# Patient Record
Sex: Male | Born: 1995
Health system: Southern US, Community
[De-identification: ages and names within clinical notes are randomized; demographics above are authoritative.]

## PROBLEM LIST (undated history)

## (undated) DIAGNOSIS — F909 Attention-deficit hyperactivity disorder, unspecified type: Secondary | ICD-10-CM

## (undated) HISTORY — DX: Attention-deficit hyperactivity disorder, unspecified type: F90.9

---
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
## 2017-04-06 ENCOUNTER — Ambulatory Visit (INDEPENDENT_AMBULATORY_CARE_PROVIDER_SITE_OTHER): Payer: 59 | Admitting: Nurse Practitioner

## 2017-04-06 ENCOUNTER — Encounter: Payer: Self-pay | Admitting: Nurse Practitioner

## 2017-04-06 VITALS — BP 121/76 | HR 110 | Temp 98.0°F | Ht 64.0 in | Wt 115.0 lb

## 2017-04-06 DIAGNOSIS — J069 Acute upper respiratory infection, unspecified: Secondary | ICD-10-CM | POA: Diagnosis not present

## 2017-04-06 DIAGNOSIS — B9689 Other specified bacterial agents as the cause of diseases classified elsewhere: Secondary | ICD-10-CM | POA: Diagnosis not present

## 2017-04-06 MED ORDER — AZITHROMYCIN 250 MG PO TABS: ORAL_TABLET | ORAL | 0 refills | Status: DC

## 2017-04-06 MED ORDER — BENZONATATE 100 MG PO CAPS: 100.0000 mg | ORAL_CAPSULE | Freq: Three times a day (TID) | ORAL | 0 refills | Status: DC | PRN

## 2017-04-06 NOTE — Progress Notes (Signed)
Subjective:    Patient ID: Tim Waters, male    DOB: 03-11-1995, 22 y.o.   MRN: [id number]  HPI  Patient ocme sin today accompanied by mom with c/o being sick off and on for the last 3 weeks. He has had varying body aches and cough. Slight congestion.   Review of Systems  Constitutional: Positive for chills and fever (?).  HENT: Positive for congestion, sinus pressure and sinus pain. Negative for sore throat and trouble swallowing.   Respiratory: Positive for cough (productive).   Cardiovascular: Negative.   Gastrointestinal: Negative.   Musculoskeletal: Positive for back pain, myalgias and neck pain.  Neurological: Positive for headaches.  All other systems reviewed and are negative.      Objective:   Physical Exam  Constitutional: He is oriented to person, place, and time. He appears well-developed and well-nourished. He appears distressed (mild).  HENT:  Right Ear: Tympanic membrane, external ear and ear canal normal.  Left Ear: Hearing, tympanic membrane, external ear and ear canal normal.  Nose: Mucosal edema and rhinorrhea present. Right sinus exhibits no maxillary sinus tenderness and no frontal sinus tenderness. Left sinus exhibits no maxillary sinus tenderness and no frontal sinus tenderness.  Mouth/Throat: Uvula is midline, oropharynx is clear and moist and mucous membranes are normal.  Neck: Normal range of motion. Neck supple.  Cardiovascular: Normal rate and regular rhythm.  Pulmonary/Chest: Effort normal and breath sounds normal.  Deep dry cough   Lymphadenopathy:    He has no cervical adenopathy.  Neurological: He is alert and oriented to person, place, and time.  Skin: Skin is warm.  Psychiatric: He has a normal mood and affect. His behavior is normal. Judgment and thought content normal.   BP 121/76   Pulse (!) 110   Temp 98 F (36.7 C) (Oral)   Ht 5\' 4"  (1.626 m)   Wt 115 lb (52.2 kg)   BMI 19.74 kg/m         Assessment & Plan:  1.  Bacterial upper respiratory infection 1. Take meds as prescribed 2. Use a cool mist humidifier especially during the winter months and when heat has been humid. 3. Use saline nose sprays frequently 4. Saline irrigations of the nose can be very helpful if done frequently.  * 4X daily for 1 week*  * Use of a nettie pot can be helpful with this. Follow directions with this* 5. Drink plenty of fluids 6. Keep thermostat turn down low 7.For any cough or congestion  Use plain Mucinex- regular strength or max strength is fine   * Children- consult with Pharmacist for dosing 8. For fever or aces or pains- take tylenol or ibuprofen appropriate for age and weight.  * for fevers greater than 101 orally you may alternate ibuprofen and tylenol every  3 hours.   Meds ordered this encounter  Medications  . azithromycin (ZITHROMAX Z-PAK) 250 MG tablet    Sig: As directed    Dispense:  6 tablet    Refill:  0    Order Specific Question:   Supervising Provider    Answer:   VINCENT, CAROL L [[id number]]  . benzonatate (TESSALON PERLES) 100 MG capsule    Sig: Take 1 capsule (100 mg total) by mouth 3 (three) times daily as needed for cough.    Dispense:  20 capsule    Refill:  0    Order Specific Question:   Supervising Provider    Answer:   Johna SheriffVINCENT, CAROL L [[id number]]  Mary-Margaret Hassell Done, Tim Waters

## 2017-04-06 NOTE — Patient Instructions (Signed)

## 2018-09-10 ENCOUNTER — Ambulatory Visit (INDEPENDENT_AMBULATORY_CARE_PROVIDER_SITE_OTHER): Payer: 59 | Admitting: Nurse Practitioner

## 2018-09-10 ENCOUNTER — Encounter: Payer: Self-pay | Admitting: Nurse Practitioner

## 2018-09-10 DIAGNOSIS — J301 Allergic rhinitis due to pollen: Secondary | ICD-10-CM | POA: Diagnosis not present

## 2018-09-10 DIAGNOSIS — R6889 Other general symptoms and signs: Secondary | ICD-10-CM

## 2018-09-10 DIAGNOSIS — Z20822 Contact with and (suspected) exposure to covid-19: Secondary | ICD-10-CM

## 2018-09-10 MED ORDER — FLUTICASONE PROPIONATE 50 MCG/ACT NA SUSP: 2.0000 | Freq: Every day | NASAL | 6 refills | Status: DC

## 2018-09-10 NOTE — Progress Notes (Signed)
   Virtual Visit via telephone Note Due to COVID-19 pandemic this visit was conducted virtually. This visit type was conducted due to national recommendations for restrictions regarding the COVID-19 Pandemic (e.g. social distancing, sheltering in place) in an effort to limit this patient's exposure and mitigate transmission in our community. All issues noted in this document were discussed and addressed.  A physical exam was not performed with this format.  I connected with Tim Waters on 09/10/18 at 1:55 by telephone and verified that I am speaking with the correct person using two identifiers. Tim Waters is currently located at a garage and no one  is currently with him during visit. The provider, Mary-Margaret Hassell Done, FNP is located in their office at time of visit.  I discussed the limitations, risks, security and privacy concerns of performing an evaluation and management service by telephone and the availability of in person appointments. I also discussed with the patient that there may be a patient responsible charge related to this service. The patient expressed understanding and agreed to proceed.   History and Present Illness:   Chief Complaint: GI Problem   HPI Patient calls in today c/o achy all over with slight cough. Feels hot but is shivering. He had some abdominal pain last night. This all started Saturday. Feels no better today except his stomach is no longer hurting. He also says he has had a headache for 3 days. He does have allergy issues that make him feel the same some times.  * he denies currently wearing a mask.    Review of Systems  Constitutional: Positive for chills. Negative for fever.  HENT: Positive for congestion and sinus pain.   Respiratory: Positive for cough.   Neurological: Positive for dizziness and headaches.  All other systems reviewed and are negative.    Observations/Objective: Alert and oriented- answers all questions  appropriately No distress  Assessment and Plan: Tim Waters in today with chief complaint of GI Problem   1. Seasonal allergic rhinitis due to pollen Force fluids - fluticasone (FLONASE) 50 MCG/ACT nasal spray; Place 2 sprays into both nostrils daily.  Dispense: 16 g; Refill: 6  2. Suspected Covid-19 Virus Infection He refuses to be tested- wants a not e to go back to work on Wednesday- I told him if he has covid he should not go back to work and needs to make sure what he has so as not to expose others to virus.    Follow Up Instructions: Prn     I discussed the assessment and treatment plan with the patient. The patient was provided an opportunity to ask questions and all were answered. The patient agreed with the plan and demonstrated an understanding of the instructions.   The patient was advised to call back or seek an in-person evaluation if the symptoms worsen or if the condition fails to improve as anticipated.  The above assessment and management plan was discussed with the patient. The patient verbalized understanding of and has agreed to the management plan. Patient is aware to call the clinic if symptoms persist or worsen. Patient is aware when to return to the clinic for a follow-up visit. Patient educated on when it is appropriate to go to the emergency department.   Time call ended:  12:15  I provided 20 minutes of non-face-to-face time during this encounter.    Mary-Margaret Hassell Done, FNP

## 2018-09-11 ENCOUNTER — Telehealth: Payer: Self-pay | Admitting: Family

## 2018-09-11 NOTE — Telephone Encounter (Signed)
Spoke with mother on phone. Patient was not told he was getting a zpak. He has a virus. Wanted him to be tested for covid.

## 2020-02-13 ENCOUNTER — Other Ambulatory Visit: Payer: Self-pay

## 2020-02-13 DIAGNOSIS — Z20822 Contact with and (suspected) exposure to covid-19: Secondary | ICD-10-CM

## 2020-02-14 LAB — NOVEL CORONAVIRUS, NAA: SARS-CoV-2, NAA: DETECTED — AB

## 2020-02-14 LAB — SARS-COV-2, NAA 2 DAY TAT

## 2020-02-17 ENCOUNTER — Ambulatory Visit: Payer: Self-pay | Admitting: *Deleted

## 2020-02-17 NOTE — Telephone Encounter (Signed)
I got an email in MyChart that I have a positive covid test but I can't find the results.  I see the results in his MyChart but he is not.  I gave him the [facility] number so he could call them for the results plus he needs a copy of his results for work which they send out.   He also mentioned the results were not in the [facility] site either.  He was agreeable to calling [facility] and thanked me for my help.    Reason for Disposition . Health Information question, no triage required and triager able to answer question  Answer Assessment - Initial Assessment Questions 1. REASON FOR CALL or QUESTION: "What is your reason for calling today?" or "How can I best help you?" or "What question do you have that I can help answer?"     I can't find my positive covid test result on MyChart  Protocols used: INFORMATION ONLY CALL - NO TRIAGE-A-AH

## 2020-08-11 ENCOUNTER — Ambulatory Visit (INDEPENDENT_AMBULATORY_CARE_PROVIDER_SITE_OTHER): Payer: BC Managed Care – PPO

## 2020-08-11 ENCOUNTER — Ambulatory Visit: Payer: BC Managed Care – PPO | Admitting: Nurse Practitioner

## 2020-08-11 ENCOUNTER — Other Ambulatory Visit: Payer: Self-pay

## 2020-08-11 ENCOUNTER — Encounter: Payer: Self-pay | Admitting: Nurse Practitioner

## 2020-08-11 VITALS — BP 121/75 | HR 65 | Temp 98.2°F | Resp 20 | Ht 64.0 in | Wt 104.0 lb

## 2020-08-11 DIAGNOSIS — M778 Other enthesopathies, not elsewhere classified: Secondary | ICD-10-CM | POA: Diagnosis not present

## 2020-08-11 DIAGNOSIS — M25532 Pain in left wrist: Secondary | ICD-10-CM

## 2020-08-11 MED ORDER — PREDNISONE 20 MG PO TABS: 40.0000 mg | ORAL_TABLET | Freq: Every day | ORAL | 0 refills | Status: AC

## 2020-08-11 NOTE — Progress Notes (Signed)
   Subjective:    Patient ID: Tim Waters, male    DOB: 04-15-1995, 25 y.o.   MRN: [id number]   Chief Complaint: Wrist Pain (Left/)   HPI Patient comes in today c/o left wrist pain. He works at [employer] and uses his hands a lot. Saturday he was at work and wrist just started hurting. Pain only when he is moving it. Rates pain5/10 crrenlty. Holding it still helps pain. He denies any injury.    Review of Systems  Constitutional:  Negative for diaphoresis.  Eyes:  Negative for pain.  Respiratory:  Negative for shortness of breath.   Cardiovascular:  Negative for chest pain, palpitations and leg swelling.  Gastrointestinal:  Negative for abdominal pain.  Endocrine: Negative for polydipsia.  Skin:  Negative for rash.  Neurological:  Negative for dizziness, weakness and headaches.  Hematological:  Does not bruise/bleed easily.  All other systems reviewed and are negative.     Objective:   Physical Exam Constitutional:      Appearance: Normal appearance.  Musculoskeletal:     Comments: No point tenderness with left wrist- no edema FROM with pain on full flexion. Grips strong  Skin:    General: Skin is warm.  Neurological:     General: No focal deficit present.     Mental Status: He is alert and oriented to person, place, and time.  Psychiatric:        Mood and Affect: Mood normal.        Behavior: Behavior normal.    BP 121/75   Pulse 65   Temp 98.2 F (36.8 C) (Temporal)   Resp 20   Ht 5\' 4"  (1.626 m)   Wt 104 lb (47.2 kg)   SpO2 99%   BMI 17.85 kg/m        Assessment & Plan:  in today with chief complaint of Wrist Pain (Left/)   1. Left wrist pain - DG Wrist Complete Left  2. Tendonitis of wrist, left Ice bid Rest Brace  Meds ordered this encounter  Medications   predniSONE (DELTASONE) 20 MG tablet    Sig: Take 2 tablets (40 mg total) by mouth daily with breakfast for 5 days. 2 po daily for 5 days     Dispense:  10 tablet    Refill:  0    Order Specific Question:   Supervising Provider    Answer:   Tim Waters A [[id number]]        The above assessment and management plan was discussed with the patient. The patient verbalized understanding of and has agreed to the management plan. Patient is aware to call the clinic if symptoms persist or worsen. Patient is aware when to return to the clinic for a follow-up visit. Patient educated on when it is appropriate to go to the emergency department.   Mary-Margaret Arville Care, FNP

## 2020-08-11 NOTE — Patient Instructions (Signed)
Tendinitis  Tendinitis is inflammation of a tendon. A tendon is a strong cord of tissuethat connects muscle to bone. Tendinitis can affect any tendon, but it most commonly affects the: Shoulder tendon (rotator cuff). Ankle tendon (Achilles tendon). Elbow tendon (triceps tendon). Tendons in the wrist. What are the causes? This condition may be caused by: Overusing a tendon or muscle. This is common. Age-related wear and tear. Injury. Inflammatory conditions, such as arthritis. Certain medicines. What increases the risk? You are more likely to develop this condition if you do activities that involve the same movements over and over again (repetitive motions). What are the signs or symptoms? Symptoms of this condition may include: Pain. Tenderness. Mild swelling. Decreased range of motion. How is this diagnosed? This condition is diagnosed with a physical exam. You may also have tests, such as: Ultrasound. This uses sound waves to make an image of the inside of your body in the affected area. MRI. How is this treated? This condition may be treated by resting, icing, applying pressure (compression), and raising (elevating) the affected area above the level of your heart. This is known as RICE therapy. Treatment may also include: Medicines to help reduce inflammation or to help reduce pain. Exercises or physical therapy to strengthen and stretch the tendon. A brace or splint. Surgery. This is rarely needed. Follow these instructions at home: If you have a splint or brace: Wear the splint or brace as told by your health care provider. Remove it only as told by your health care provider. Loosen the splint or brace if your fingers or toes tingle, become numb, or turn cold and blue. Keep the splint or brace clean. If the splint or brace is not waterproof: Do not let it get wet. Cover it with a watertight covering when you take a bath or shower. Managing pain, stiffness, and  swelling If directed, put ice on the affected area. If you have a removable splint or brace, remove it as told by your health care provider. Put ice in a plastic bag. Place a towel between your skin and the bag. Leave the ice on for 20 minutes, 2-3 times a day. Move the fingers or toes of the affected limb often, if this applies. This can help to prevent stiffness and lessen swelling. If directed, raise (elevate) the affected area above the level of your heart while you are sitting or lying down. If directed, apply heat to the affected area before you exercise. Use the heat source that your health care provider recommends, such as a moist heat pack or a heating pad.     Place a towel between your skin and the heat source. Leave the heat on for 20-30 minutes. Remove the heat if your skin turns bright red. This is especially important if you are unable to feel pain, heat, or cold. You may have a greater risk of getting burned.  Driving Do not drive or use heavy machinery while taking prescription pain medicine. Ask your health care provider when it is safe to drive if you have a splint or brace on any part of your arm or leg. Activity Rest the affected area as told by your health care provider. Return to your normal activities as told by your health care provider. Ask your health care provider what activities are safe for you. Avoid using the affected area while you are experiencing symptoms of tendinitis. Do exercises as told by your health care provider. General instructions If you have a splint,   do not put pressure on any part of the splint until it is fully hardened. This may take several hours. Wear an elastic bandage or compression wrap only as told by your health care provider. Take over-the-counter and prescription medicines only as told by your health care provider. Keep all follow-up visits as told by your health care provider. This is important. Contact a health care provider  if: Your symptoms do not improve. You develop new, unexplained problems, such as numbness in your hands. Summary Tendinitis is inflammation of a tendon. You are more likely to develop this condition if you do activities that involve the same movements over and over again. This condition may be treated by resting, icing, applying pressure (compression), and elevating the area above the level of your heart. This is known as RICE therapy. Avoid using the affected area while you are experiencing symptoms of tendinitis. This information is not intended to replace advice given to you by your health care provider. Make sure you discuss any questions you have with your healthcare provider. Document Revised: 07/11/2017 Document Reviewed: 05/24/2017 Elsevier Patient Education  2022 [employer]  

## 2021-05-04 ENCOUNTER — Encounter: Payer: Self-pay | Admitting: Family Medicine

## 2021-05-04 ENCOUNTER — Ambulatory Visit: Payer: BC Managed Care – PPO | Admitting: Family Medicine

## 2021-05-04 VITALS — BP 108/64 | HR 80 | Temp 98.2°F | Ht 64.0 in | Wt 113.8 lb

## 2021-05-04 DIAGNOSIS — M791 Myalgia, unspecified site: Secondary | ICD-10-CM | POA: Diagnosis not present

## 2021-05-04 MED ORDER — NAPROXEN 500 MG PO TABS: 500.0000 mg | ORAL_TABLET | Freq: Two times a day (BID) | ORAL | 0 refills | Status: DC

## 2021-05-04 MED ORDER — METHOCARBAMOL 500 MG PO TABS: 500.0000 mg | ORAL_TABLET | Freq: Three times a day (TID) | ORAL | 0 refills | Status: DC | PRN

## 2021-05-04 NOTE — Patient Instructions (Signed)
Muscle rub such as Biofreeze or Horse Liniment ?Heating pad ?NSAID - Naproxen ?Muscle relaxer - Robaxin ?

## 2021-05-04 NOTE — Progress Notes (Signed)
? ?  Assessment & Plan:  ?1. Muscle pain ?Education provided on muscle pain.  Encouraged use of a muscle rub, heating pad, NSAIDs, and muscle relaxer.  Prescriptions provided for naproxen and Robaxin. ?- naproxen (NAPROSYN) 500 MG tablet; Take 1 tablet (500 mg total) by mouth 2 (two) times daily with a meal.  Dispense: 60 tablet; Refill: 0 ?- methocarbamol (ROBAXIN) 500 MG tablet; Take 1 tablet (500 mg total) by mouth every 8 (eight) hours as needed for muscle spasms.  Dispense: 60 tablet; Refill: 0 ? ? ?Follow up plan: Return if symptoms worsen or fail to improve. ? ?Hendricks Limes, MSN, APRN, FNP-C ?[place] ? ?Subjective:  ? ?Patient ID: Tim Waters, male    DOB: November 21, 1995, 26 y.o.   MRN: [id number] ? ?HPI: ?Tim Waters is a 26 y.o. male presenting on 05/04/2021 for Shoulder Pain (Right x 2-3 days- [facility]) ? ?Patient complaints of right shoulder pain. No known injury. The onset of the pain was  a few days ago .  The pain occurs when active.  No home treatments.  Patient works at [employer]. ? ? ?ROS: Negative unless specifically indicated above in HPI.  ? ?Relevant past medical history reviewed and updated as indicated.  ? ?Allergies and medications reviewed and updated. ? ?No current outpatient medications on file. ? ?Allergies  ?Allergen Reactions  ? Latex   ? Sulfa Antibiotics   ? ? ?Objective:  ? ?BP 108/64   Pulse 80   Temp 98.2 ?F (36.8 ?C) (Temporal)   Ht 5\' 4"  (1.626 m)   Wt 113 lb 12.8 oz (51.6 kg)   BMI 19.53 kg/m?   ? ?Physical Exam ?Vitals reviewed.  ?Constitutional:   ?   General: He is not in acute distress. ?   Appearance: Normal appearance. He is not ill-appearing, toxic-appearing or diaphoretic.  ?HENT:  ?   Head: Normocephalic and atraumatic.  ?Eyes:  ?   General: No scleral icterus.    ?   Right eye: No discharge.     ?   Left eye: No discharge.  ?   Conjunctiva/sclera: Conjunctivae normal.  ?Cardiovascular:  ?   Rate and Rhythm: Normal rate.   ?Pulmonary:  ?   Effort: Pulmonary effort is normal. No respiratory distress.  ?Musculoskeletal:     ?   General: Normal range of motion.  ?   Cervical back: Normal range of motion.  ?   Comments: Pain in right trapezius muscle.  ?Skin: ?   General: Skin is warm and dry.  ?Neurological:  ?   Mental Status: He is alert and oriented to person, place, and time. Mental status is at baseline.  ?Psychiatric:     ?   Mood and Affect: Mood normal.     ?   Behavior: Behavior normal.     ?   Thought Content: Thought content normal.     ?   Judgment: Judgment normal.  ? ? ? ? ? ? ?

## 2021-09-24 ENCOUNTER — Encounter: Payer: Self-pay | Admitting: Nurse Practitioner

## 2021-09-24 ENCOUNTER — Ambulatory Visit: Payer: BC Managed Care – PPO | Admitting: Nurse Practitioner

## 2021-09-24 VITALS — BP 110/71 | HR 96 | Temp 98.7°F | Ht 64.0 in | Wt 109.4 lb

## 2021-09-24 DIAGNOSIS — M778 Other enthesopathies, not elsewhere classified: Secondary | ICD-10-CM | POA: Diagnosis not present

## 2021-09-24 MED ORDER — PREDNISONE 20 MG PO TABS: 40.0000 mg | ORAL_TABLET | Freq: Every day | ORAL | 0 refills | Status: AC

## 2021-09-24 NOTE — Progress Notes (Signed)
   Subjective:    Patient ID: Tim Waters, male    DOB: 05-16-1995, 26 y.o.   MRN: [id number]   Chief Complaint: Wrist Pain (Right x 1 week )   Wrist Pain  The pain is present in the right wrist. This is a new problem. There has been no history of extremity trauma. The problem occurs intermittently. The problem has been waxing and waning. The quality of the pain is described as aching. The pain is at a severity of 4/10. The pain is mild. The symptoms are aggravated by activity. He has tried nothing for the symptoms. The treatment provided no relief. Family history does not include gout or rheumatoid arthritis. There is no history of diabetes, gout, osteoarthritis or rheumatoid arthritis.       Review of Systems  Musculoskeletal:  Positive for arthralgias (RIGHT WRIST). Negative for gout.  Psychiatric/Behavioral:  Positive for hallucinations.        Objective:   Physical Exam Constitutional:      Appearance: Normal appearance.  Cardiovascular:     Rate and Rhythm: Normal rate and regular rhythm.     Heart sounds: Normal heart sounds.  Pulmonary:     Breath sounds: Normal breath sounds.  Musculoskeletal:     Comments: From OF RIGHT WRIST WITHOUT PAIN Point tenderness on dorsal surface of wrist  Skin:    General: Skin is warm.  Neurological:     General: No focal deficit present.     Mental Status: He is alert and oriented to person, place, and time.  Psychiatric:        Mood and Affect: Mood normal.        Behavior: Behavior normal.   BP 110/71   Pulse 96   Temp 98.7 F (37.1 C) (Temporal)   Ht 5\' 4"  (1.626 m)   Wt 109 lb 6.4 oz (49.6 kg)   SpO2 96%   BMI 18.78 kg/m          Assessment & Plan:   in today with chief complaint of Wrist Pain (Right x 1 week )   1. Right wrist tendonitis Ice BID Wear wrist brace Meds ordered this encounter  Medications   predniSONE (DELTASONE) 20 MG tablet    Sig: Take 2 tablets (40 mg  total) by mouth daily with breakfast for 5 days. 2 po daily for 5 days    Dispense:  10 tablet    Refill:  0    Order Specific Question:   Supervising Provider    Answer:   Tim Waters A [[id number]]       The above assessment and management plan was discussed with the patient. The patient verbalized understanding of and has agreed to the management plan. Patient is aware to call the clinic if symptoms persist or worsen. Patient is aware when to return to the clinic for a follow-up visit. Patient educated on when it is appropriate to go to the emergency department.   Mary-Margaret Arville Care, FNP

## 2021-09-24 NOTE — Patient Instructions (Signed)
Flexor Carpi Ulnaris and [employer] Radialis Tendinitis Rehab Ask your health care provider which exercises are safe for you. Do exercises exactly as told by your health care provider and adjust them as directed. It is normal to feel mild stretching, pulling, tightness, or discomfort as you do these exercises. Stop right away if you feel sudden pain or your pain gets worse. Do not begin these exercises until told by your health care provider. Range-of-motion exercises These exercises warm up your muscles and joints and improve the movement and flexibility of your forearm. These exercises also help to relieve pain, numbness, and tingling. They are done using the muscles in your injured forearm. Wrist flexion Sit or stand with your left / right elbow bent to a 90-degree angle (right angle) at your side and your palm facing the floor. Slowly bend your wrist so that your fingers move toward the floor (flexion), stopping when you feel a gentle stretch over the back of your forearm. Hold this position for __________ seconds. Slowly return to the starting position. Repeat __________ times. Complete this exercise __________ times a day. Wrist extension Sit or stand with your left / right elbow bent to a 90-degree angle (right angle) at your side and your palm facing the floor. Slowly lift your wrist up so that your fingers move toward the ceiling (extension), stopping when you feel a gentle stretch on the palm side of your forearm. Hold this position for __________ seconds. Slowly return to the starting position. Repeat __________ times. Complete this exercise __________ times a day. Forearm rotation, supination Sit or stand with your left / right elbow bent to a 90-degree angle (right angle) at your side. Position your forearm so that the thumb is facing the ceiling (neutral position). Turn (rotate) your palm up toward the ceiling (supination) until you feel a gentle stretch on the inside of your  forearm. Hold this position for __________ seconds. Slowly return your palm to the starting position. Repeat __________ times. Complete this exercise __________ times a day. Forearm rotation, pronation Sit or stand with your left / right elbow bent to a 90-degree angle (right angle) at your side. Position your forearm so that the thumb is facing the ceiling (neutral position). Turn (rotate) your palm down toward the floor (pronation) until you feel a gentle stretch on the top of your forearm. Hold this position for __________ seconds. Slowly return your palm to the starting position. Repeat __________ times. Complete this exercise __________ times a day. Stretching exercises These exercises warm up your muscles and joints and improve the movement and flexibility of your forearm. These exercises also help to relieve pain, numbness, and tingling. They are done using your healthy forearm to help stretch the muscles in your injured forearm. Wrist flexion, assisted  Sit or stand and extend your left / right arm in front of you. Turn your palm down toward the floor and relax your wrist. With your other hand (assisted), gently push on the back of your hand to bend your wrist and fingers toward the floor (flexion). Stop when you feel a gentle stretch on the top of your forearm. Hold this position for __________ seconds. Slowly return to the starting position. Repeat __________ times. Complete this exercise __________ times a day. Wrist extension, assisted  Sit or stand and extend your left / right arm in front of you. Turn your palm up toward the ceiling and relax your wrist. With your other hand (assisted), gently pull your palm and fingertips  back so your fingers point down toward the floor (extension). You should feel a gentle stretch on the palm-side of your forearm. Hold this position for __________ seconds. Slowly return to the starting position. Repeat __________ times. Complete this exercise  __________ times a day. Forearm rotation, supination Sit with your left / right elbow bent to a 90-degree angle (right angle) with your forearm resting on a table. Keeping your upper body and shoulder still, use your other hand to turn (rotate) your forearm palm-up (supination) until you feel a gentle to moderate stretch. Hold this position for __________ seconds. Slowly release the stretch, and return to the starting position. Repeat __________ times. Complete this exercise __________ times a day. Forearm rotation, pronation Sit with your left / right elbow bent to a 90-degree angle (right angle) with your forearm resting on a table. Keeping your upper body and shoulder still, use your other hand to turn (rotate) your forearm palm down (pronation) until you feel a gentle to moderate stretch. Hold this position for __________ seconds. Slowly release the stretch, and return to the starting position. Repeat __________ times. Complete this exercise __________ times a day. Strengthening exercises These exercises build strength and endurance in your wrist and forearm. Endurance is the ability to use your muscles for a long time, even after they get tired. Wrist flexion  Sit with your left / right forearm supported on a table and your hand resting palm-up over the edge of the table. Your elbow should be bent to a 90-degree angle (right angle) and rest below the level of your shoulder. Hold a __________ weight in your hand. Or, hold a rubber exercise band or tube in both hands, keeping your hands at the same level and hip distance apart. There should be a slight tension in the exercise band or tube. Slowly lift your hand up toward the ceiling (flexion). Hold this position for __________ seconds. Slowly lower your hand back to the starting position. Repeat __________ times. Complete this exercise __________ times a day. Wrist extension  Sit with your left / right forearm supported on a table and  your hand resting palm-down over the edge of the table. Your elbow should be bent to a 90-degree angle (right angle) and rest below the level of your shoulder. Hold a __________ weight in your hand. Or, hold a rubber exercise band or tube in both hands, keeping your hands at the same level and hip distance apart. There should be a slight tension in the exercise band or tube. Slowly lift your hand up toward the ceiling (extension). Hold this position for __________ seconds. Slowly lower your hand back to the starting position. Repeat __________ times. Complete this exercise __________ times a day. Ulnar deviation Stand with a __________ weight in your left / right hand. Or, sit with your healthy hand supported, and hold on to a rubber exercise band or tube with both hands. There should be a slight tension in the exercise band or tube. Move your wrist so your pinkie travels toward your forearm and your thumb moves away from your forearm (ulnar deviation). Hold this position for __________ seconds. Slowly return to the starting position. Repeat __________ times. Complete this exercise __________ times a day. Radial deviation Stand with a __________ weight in your left / right hand. Or, sit and hold on to a rubber exercise band or tube with both hands while your left / right arm is supported on a table and the other arm is below the  table. There should be a slight tension in the exercise band or tube. If you are holding a weight, raise your left / right hand so your thumb moves toward your forearm at a comfortable height. You will not need to raise your hand very far. If you are holding an exercise band or tube, pull on it to raise your thumb toward the ceiling. You will not need to raise your hand very far. Hold this position for __________ seconds. Slowly lower your wrist to the starting position. Repeat __________ times. Complete this exercise __________ times a day. Forearm rotation,  supination  Sit with your left / right forearm supported on a table. Keep your hand resting palm-down and your wrist over the edge of the table. Your elbow should be at your side and bent to a 90-degree angle (right angle). Keep your wrist stable. Do not allow it to bend backward or forward during the exercise. Gently hold a lightweight hammer with your left / right hand. Without moving your elbow or wrist, slowly turn (rotate) your forearm so your palm faces up toward the ceiling (supination). Hold this position for __________ seconds. Slowly return your forearm to the starting position. Repeat __________ times. Complete this exercise __________ times a day. Forearm rotation, pronation  Sit with your left / right forearm supported on a table. Keep your hand resting palm-up and your wrist over the edge of the table. Your elbow should be at your side and bent to a 90-degree angle (right angle). Keep your wrist stable. Do not allow it to bend backward or forward during the exercise. Gently hold a lightweight hammer with your left / right hand. Without moving your elbow or wrist, slowly turn (rotate) your palm down toward the floor (pronation). Hold this position for __________ seconds. Slowly return your forearm to the starting position. Repeat __________ times. Complete this exercise __________ times a day. Grip  Hold one of these items in your left / right hand: modeling clay, therapy putty, a dense sponge, a stress ball, or a large, rolled sock. Squeeze as hard as you can without increasing any pain. Hold this position for __________ seconds. Slowly release your grip. Repeat __________ times. Complete this exercise __________ times a day. This information is not intended to replace advice given to you by your health care provider. Make sure you discuss any questions you have with your health care provider. Document Revised: 05/01/2018 Document Reviewed: 03/08/2018 Elsevier Patient Education   2023 [employer].

## 2021-10-05 DIAGNOSIS — H5213 Myopia, bilateral: Secondary | ICD-10-CM | POA: Diagnosis not present

## 2021-10-05 DIAGNOSIS — H5210 Myopia, unspecified eye: Secondary | ICD-10-CM | POA: Diagnosis not present

## 2022-12-01 IMAGING — DX DG WRIST COMPLETE 3+V*L*
3 series · 3 of 3 positions shown · non-contrast
Comparison: No prior.

CLINICAL DATA: Left wrist pain.

EXAM:
LEFT WRIST - COMPLETE 3+ VIEW

[wrist ap]
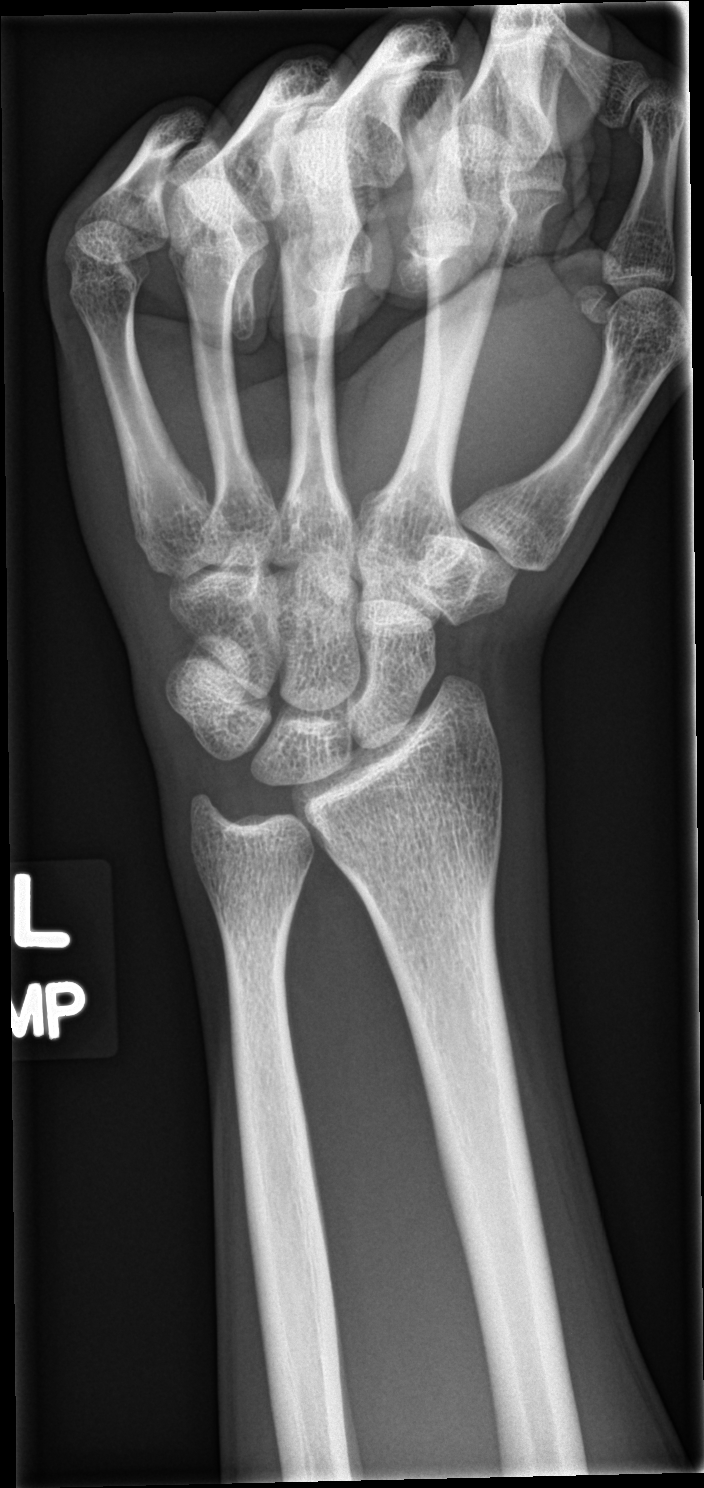

[wrist lat]
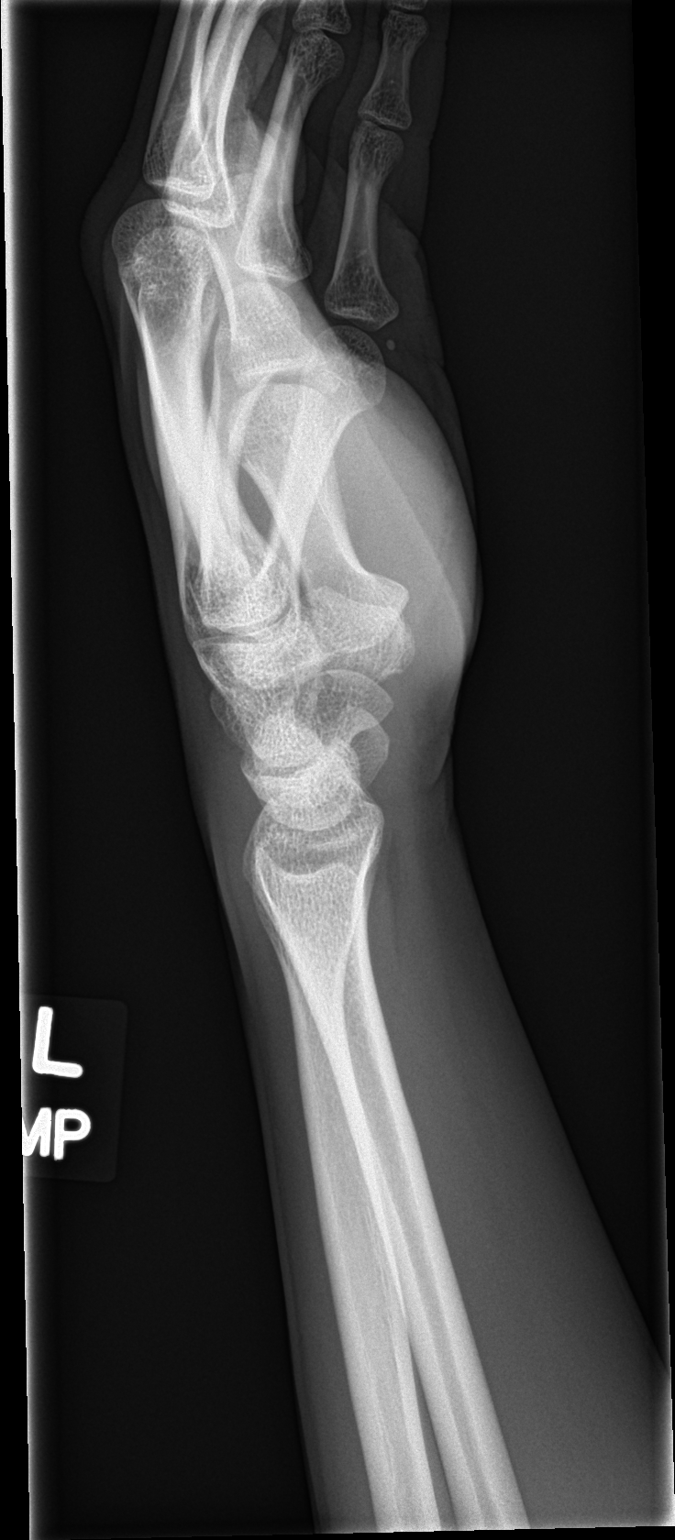

[wrist obl]
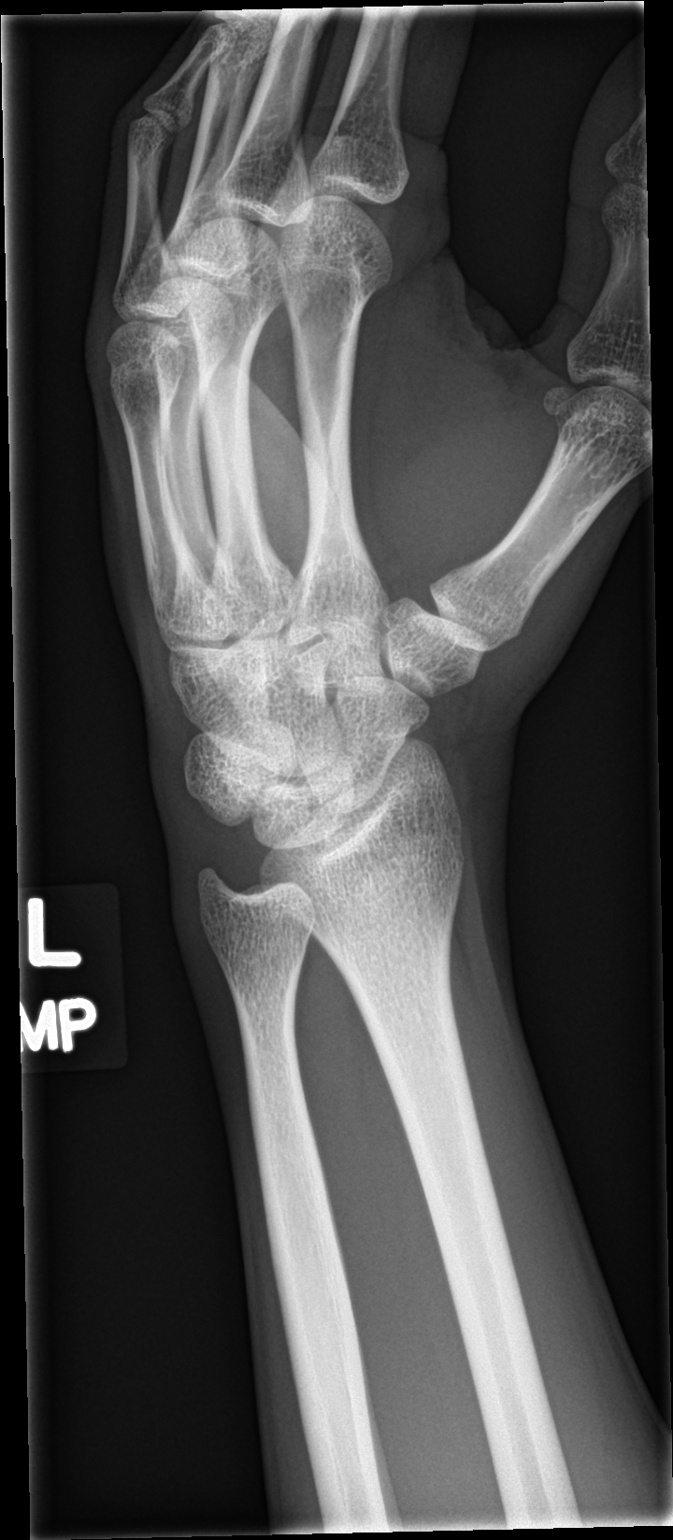

[3 of 3 positions shown; findings below may reference images not displayed]

FINDINGS: No acute bony or joint abnormality. No evidence of fracture or
dislocation.
IMPRESSION: No acute abnormality.

## 2023-03-15 DIAGNOSIS — H5213 Myopia, bilateral: Secondary | ICD-10-CM | POA: Diagnosis not present

## 2023-11-08 ENCOUNTER — Ambulatory Visit (INDEPENDENT_AMBULATORY_CARE_PROVIDER_SITE_OTHER): Payer: Self-pay | Admitting: Family Medicine

## 2023-11-08 ENCOUNTER — Ambulatory Visit: Payer: Self-pay

## 2023-11-08 ENCOUNTER — Encounter: Payer: Self-pay | Admitting: Family Medicine

## 2023-11-08 VITALS — BP 119/70 | HR 71 | Temp 98.2°F | Ht 65.0 in | Wt 120.2 lb

## 2023-11-08 DIAGNOSIS — S93492A Sprain of other ligament of left ankle, initial encounter: Secondary | ICD-10-CM

## 2023-11-08 MED ORDER — DICLOFENAC SODIUM 75 MG PO TBEC: 75.0000 mg | DELAYED_RELEASE_TABLET | Freq: Two times a day (BID) | ORAL | 0 refills | Status: DC | PRN

## 2023-11-08 NOTE — Telephone Encounter (Signed)
 Pt has appt

## 2023-11-08 NOTE — Progress Notes (Signed)
 Subjective: CC: Rolled ankle PCP: Gladis Mustard, FNP YEP:Tim Waters is a 28 y.o. male presenting to clinic today for:  Patient reports that he rolled his ankle about 5 days ago when he stepped in a small divot while walking his dogs.  He notes that he everted his left ankle and really did not have any significant pain until later that evening when he started having some bruising along the heel side of that ankle laterally.  He denies any significant swelling.  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  It is progressively gotten little bit worse.  He just purchased an ankle brace from [employer] and intends to start using it.  He has been utilizing some Tylenol but it really has not been that helpful.  He denies any numbness, tingling or deformity.  He is able to ambulate on it but it is somewhat uncomfortable   ROS: Per HPI  Allergies  Allergen Reactions   Latex    Sulfa Antibiotics    Past Medical History:  Diagnosis Date   ADHD (attention deficit hyperactivity disorder)     Current Outpatient Medications:    diclofenac (VOLTAREN) 75 MG EC tablet, Take 1 tablet (75 mg total) by mouth 2 (two) times daily as needed for moderate pain (pain score 4-6). Take with food. NO other meds except Tylenol if needed over the counter, Disp: 30 tablet, Rfl: 0   methocarbamol  (ROBAXIN ) 500 MG tablet, Take 1 tablet (500 mg total) by mouth every 8 (eight) hours as needed for muscle spasms. (Patient not taking: Reported on 11/08/2023), Disp: 60 tablet, Rfl: 0   naproxen  (NAPROSYN ) 500 MG tablet, Take 1 tablet (500 mg total) by mouth 2 (two) times daily with a meal. (Patient not taking: Reported on 11/08/2023), Disp: 60 tablet, Rfl: 0 Social History   Socioeconomic History   Marital status: Unknown    Spouse name: Not on file   Number of children: Not on file   Years of education: Not on file    Highest education level: Not on file  Occupational History   Not on file  Tobacco Use   Smoking status: Never   Smokeless tobacco: Never  Substance and Sexual Activity   Alcohol use: No   Drug use: No   Sexual activity: Not on file  Other Topics Concern   Not on file  Social History Narrative   ** Merged History Encounter **       Social Drivers of Health   Financial Resource Strain: Not on file  Food Insecurity: Not on file  Transportation Needs: Not on file  Physical Activity: Not on file  Stress: Not on file  Social Connections: Not on file  Intimate Partner Violence: Not on file   Family History  Problem Relation Age of Onset   Hypertension Father    Heart disease Maternal Grandfather    Heart disease Paternal Grandmother    Heart disease Paternal Grandfather     Objective: Office vital signs reviewed. BP 119/70   Pulse 71   Temp 98.2 F (36.8 C)   Ht 5' 5 (1.651 m)   Wt 120 lb 4 oz (54.5 kg)   SpO2 98%   BMI 20.01 kg/m   Physical Examination:  General: Awake, alert, well nourished, No acute distress MSK: Antalgic gait.  He is ambulating independently.  Left ankle with no gross  swelling, ecchymosis, deformity or discoloration.  He has no tenderness palpation to the distal lateral malleolus.  No tenderness palpation over the ATFL or posterior ligaments.  His mild tenderness to palpation is present along the insertion site of the Achilles tendon but there are no palpable abnormalities in this area including fluctuance or evidence of tear.  He is able to dorsiflex and plantarflex his left foot without difficulty.  Assessment/ Plan: 28 y.o. male   Sprain of other ligament of left ankle, initial encounter - Plan: diclofenac (VOLTAREN) 75 MG EC tablet   I am going to treat him as a sprain.  I agree with use of brace.  Encouraged ice.  I am ordering an oral anti-inflammatory.  I would like him to be nonweightbearing for the next couple of days and then he can  resume normal activities at work if he is feeling okay.  There is certainly no evidence of Achilles tendon tear/rupture.  No evidence of fracture or severe sprain on exam.  Follow-up as needed   Norene CHRISTELLA Fielding, DO [facility] [phone]

## 2023-11-08 NOTE — Patient Instructions (Signed)
 Ankle Sprain  An ankle sprain is a stretch or tear in a ligament in your ankle. Ligaments are tissues that connect bones to each other.  An ankle sprain can happen when: The ankle rolls outward. This is called an inversion sprain. The ankle rolls inward. This is called an eversion sprain. What are the causes? An ankle sprain is caused by rolling or twisting your ankle. What increases the risk? You are more likely to get an ankle sprain if you play sports. What are the signs or symptoms?  Pain in your ankle. Swelling. Bruising. Bruises may form right after you sprain your ankle or 1-2 days later. Trouble standing or walking. How is this treated? An ankle sprain may be treated with: A brace or splint. This is used to keep the ankle from moving until it heals. An elastic bandage (dressing). This is used to support the ankle. Crutches. Pain medicine. Surgery. This may be needed if the sprain is very bad. Physical therapy. This can help you move your ankle better. Follow these instructions at home: If you have a brace or a splint that can be taken off: Wear the brace or splint as told by your doctor. Take it off only as told by your doctor. Check the skin around the brace or splint every day. Tell your doctor if you see problems. Loosen the brace or splint if your toes: Tingle. Become numb. Turn cold and blue. Keep the brace or splint clean and dry. If the brace or splint is not waterproof: Do not let it get wet. Cover it with a watertight covering when you take a bath or a shower. If you have an elastic dressing: Take it off to shower or bathe. Adjust it if it feels too tight. Loosen the dressing if your foot: Tingles. Becomes numb. Turns cold and blue. Managing pain, stiffness, and swelling If told, put ice on the affected area. If you have a removable brace or splint, take it off as told by your doctor. Put ice in a plastic bag. Place a towel between your skin and the  bag. Leave the ice on for 20 minutes, 2-3 times a day. If your skin turns bright red, take off the ice right away to prevent skin damage. The risk of damage is higher if you cannot feel pain, heat, or cold. Move your toes often. Raise your ankle above the level of your heart while you are sitting or lying down. General instructions Take over-the-counter and prescription medicines only as told by your doctor. Do not smoke or use any products that contain nicotine or tobacco. If you need help quitting, ask your doctor. Rest your ankle. Use crutches to support your body weight. Do not use your injured leg to support your body weight until your doctor says that you can. Ask your doctor when it is safe to drive if you have a brace or splint on your ankle. Contact a doctor if: Your bruises or swelling get worse all of a sudden. Your pain does not get better after you take medicine. Get help right away if: Your foot or toes are numb or blue. You have very bad pain that gets worse. This information is not intended to replace advice given to you by your health care provider. Make sure you discuss any questions you have with your health care provider. Document Revised: 10/06/2021 Document Reviewed: 10/06/2021 Elsevier Patient Education  2024 [employer].

## 2023-11-08 NOTE — Telephone Encounter (Signed)
 FYI Only or Action Required?: FYI only for provider.  Patient was last seen in primary care on 09/24/2021 by Gladis Mustard, FNP.  Called Nurse Triage reporting Ankle Injury.  Symptoms began several days ago.  Interventions attempted: Nothing.  Symptoms are: gradually worsening.  Triage Disposition: See Physician Within 24 Hours  Patient/caregiver understands and will follow disposition?: Yes        Copied from CRM #[id number]. Topic: Clinical - Red Word Triage >> Nov 08, 2023  9:29 AM Rosaria BRAVO wrote: Red Word that prompted transfer to Nurse Triage: pt fell and injured his ankle. >> Nov 08, 2023  9:34 AM Rosaria BRAVO wrote: Ppt disconnected Reason for Disposition  [1] Limp when walking AND [2] due to twisting injury  Answer Assessment - Initial Assessment Questions Pt states he felt ok the first 1-2 days but the more he walked on it the more painful it became.      1. MECHANISM: How did the injury happen? (e.g., twisting injury, direct blow)      Took dogs out and rolled his ankle in a divet 2. ONSET: When did the injury happen? (e.g., minutes or hours ago)      3-4 days ago  3. LOCATION: Where is the injury located?      ankle 4. APPEARANCE of INJURY: What does the injury look like?      There was a bruise below the ankle and now it's gone 5. WEIGHT-BEARING: Can you put weight on that foot? Can you walk (four steps or more)?       Yes but now limping. States it hurts more when he goes to lift is foot up to take a step.   7. PAIN: Is there pain? If Yes, ask: How bad is the pain?  What does it keep you from doing? (Scale 0-10; or none, mild, moderate, severe)     6 9. OTHER SYMPTOMS: Do you have any other symptoms?      no  Protocols used: Ankle Injury-A-AH

## 2023-12-19 ENCOUNTER — Ambulatory Visit: Payer: Self-pay

## 2023-12-19 NOTE — Telephone Encounter (Signed)
 FYI Only or Action Required?: FYI only for provider: appointment scheduled on 12/20/23.  Patient was last seen in primary care on 11/08/2023 by Tim Norene HERO, Tim Waters.  Called Nurse Triage reporting Knee Pain.  Symptoms began a week ago.  Interventions attempted: Nothing.  Symptoms are: unchanged.  Triage Disposition: See Physician Within 24 Hours  Patient/caregiver understands and will follow disposition?: Yes  Copied from CRM #[id number]. Topic: Clinical - Red Word Triage >> Dec 19, 2023 12:55 PM Tim Waters wrote: Red Word that prompted transfer to Nurse Triage:  Patient states has been having pretty severe knee pain, also is having hip pain - rated about 7.5. Req an appt tomorrow with clinic. Reason for Disposition  [1] Very swollen joint AND [2] no fever  Answer Assessment - Initial Assessment Questions Pt called in stating yesterday swelling in knee was so bad he was unable to walk. Pt works for [employer] and is unable to take time off due to season. He is not currently taking any medications for pain. Pt denies any falls or injury to the area. Appointment scheduled for evaluation. Patient agrees with plan of care, and will call back if anything changes, or if symptoms worsen.     1. LOCATION and RADIATION: Where is the pain located?      Left knee and hip; right hip  2. QUALITY: What does the pain feel like?  (e.g., sharp, dull, aching, burning)     Knee sharp pain; hip is nagging   3. SEVERITY: How bad is the pain? What does it keep you from doing?   (Scale 1-10; or mild, moderate, severe)     7.5/10   4. ONSET: When did the pain start? Does it come and go, or is it there all the time?     1 week   5. RECURRENT: Have you had this pain before? If Yes, ask: When, and what happened then?     None   6. SETTING: Has there been any recent work, exercise or other activity that involved that part of the body?      None   7. AGGRAVATING FACTORS: What makes the  knee pain worse? (e.g., walking, climbing stairs, running)     Walking and running   8. ASSOCIATED SYMPTOMS: Is there any swelling or redness of the knee?     Knee swelling   9. OTHER SYMPTOMS: Tim Waters you have any other symptoms? (e.g., calf pain, chest pain, difficulty breathing, fever)     none  Protocols used: Knee Pain-A-AH

## 2023-12-20 ENCOUNTER — Ambulatory Visit: Payer: Self-pay

## 2023-12-20 ENCOUNTER — Encounter: Payer: Self-pay | Admitting: Family Medicine

## 2023-12-20 VITALS — BP 112/76 | HR 55 | Temp 96.9°F | Ht 65.0 in | Wt 115.0 lb

## 2023-12-20 DIAGNOSIS — M25551 Pain in right hip: Secondary | ICD-10-CM

## 2023-12-20 DIAGNOSIS — M545 Low back pain, unspecified: Secondary | ICD-10-CM

## 2023-12-20 DIAGNOSIS — M25562 Pain in left knee: Secondary | ICD-10-CM

## 2023-12-20 MED ORDER — NAPROXEN 500 MG PO TABS: 500.0000 mg | ORAL_TABLET | Freq: Two times a day (BID) | ORAL | 0 refills | Status: AC

## 2023-12-20 NOTE — Progress Notes (Signed)
 Subjective:  Patient ID: Tim Waters, male    DOB: 22-Jun-1995, 28 y.o.   MRN: [id number]  Patient Care Team: Gladis Mustard, FNP as PCP - General (Family Medicine)   Chief Complaint:  Knee Pain (Left knee pain x 4 days after running )   HPI: Tim Waters is a 28 y.o. male presenting on 12/20/2023 for Knee Pain (Left knee pain x 4 days after running )    Tim Waters is a 28 year old male who presents with left knee and hip pain.  He has been experiencing pain in his left knee and the inner part of his hips, specifically the back part of his hip, for about a week. 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 The knee pain has been severe enough at times to prevent him from bending his knee to ninety degrees and has been associated with swelling. He has not used any wraps or medications, only stretching, due to fear of adding extra tension.  The hip pain is described as an aching sensation in the lower back area and a sharp pain in the inner hip. The pain impacts his ability to perform his job duties, which involve lifting and delivering packages. He mentions that he often bends over to pick up packages, which may contribute to his discomfort.  He works as an Pensions consultant, which involves significant physical activity, including running and lifting packages. He previously worked at [employer], a job that also required a lot of physical movement.  He experiences pain when the knee is manipulated in certain ways. He has been able to walk on the knee until the pain becomes severe.          Relevant past medical, surgical, family, and social history reviewed and updated as indicated.  Allergies and medications reviewed and updated. Data reviewed: Chart in Epic.   Past Medical History:  Diagnosis Date   ADHD (attention deficit  hyperactivity disorder)     History reviewed. No pertinent surgical history.  Social History   Socioeconomic History   Marital status: Unknown    Spouse name: Not on file   Number of children: Not on file   Years of education: Not on file   Highest education level: Not on file  Occupational History   Not on file  Tobacco Use   Smoking status: Never   Smokeless tobacco: Never  Substance and Sexual Activity   Alcohol use: No   Drug use: No   Sexual activity: Not on file  Other Topics Concern   Not on file  Social History Narrative   ** Merged History Encounter **       Social Drivers of Health   Financial Resource Strain: Not on file  Food Insecurity: Not on file  Transportation Needs: Not on file  Physical Activity: Not on file  Stress: Not on file  Social Connections: Not on file  Intimate Partner Violence: Not on file    Outpatient Encounter Medications as of 12/20/2023  Medication Sig   naproxen  (NAPROSYN ) 500 MG tablet Take 1 tablet (500 mg total) by mouth 2 (two) times daily with a meal for 14 days.   [DISCONTINUED] diclofenac  (VOLTAREN ) 75 MG EC tablet Take 1 tablet (75 mg total) by mouth 2 (two) times daily as needed for moderate pain (pain score 4-6). Take with  food. NO other meds except Tylenol if needed over the counter   [DISCONTINUED] methocarbamol  (ROBAXIN ) 500 MG tablet Take 1 tablet (500 mg total) by mouth every 8 (eight) hours as needed for muscle spasms. (Patient not taking: Reported on 11/08/2023)   [DISCONTINUED] naproxen  (NAPROSYN ) 500 MG tablet Take 1 tablet (500 mg total) by mouth 2 (two) times daily with a meal. (Patient not taking: Reported on 11/08/2023)   No facility-administered encounter medications on file as of 12/20/2023.    Allergies  Allergen Reactions   Latex    Sulfa Antibiotics     Pertinent ROS per HPI, otherwise unremarkable      Objective:  BP 112/76   Pulse (!) 55   Temp (!) 96.9 F (36.1 C)   Ht 5' 5 (1.651 m)    Wt 115 lb (52.2 kg)   SpO2 99%   BMI 19.14 kg/m    Wt Readings from Last 3 Encounters:  12/20/23 115 lb (52.2 kg)  11/08/23 120 lb 4 oz (54.5 kg)  09/24/21 109 lb 6.4 oz (49.6 kg)    Physical Exam Vitals and nursing note reviewed.  Constitutional:      General: He is not in acute distress.    Appearance: Normal appearance. He is normal weight. He is not ill-appearing, toxic-appearing or diaphoretic.  HENT:     Head: Normocephalic and atraumatic.     Mouth/Throat:     Mouth: Mucous membranes are moist.  Eyes:     Conjunctiva/sclera: Conjunctivae normal.     Pupils: Pupils are equal, round, and reactive to light.  Cardiovascular:     Rate and Rhythm: Normal rate and regular rhythm.     Pulses: Normal pulses.     Heart sounds: Normal heart sounds.  Pulmonary:     Effort: Pulmonary effort is normal.     Breath sounds: Normal breath sounds.  Musculoskeletal:     Cervical back: Normal and neck supple.     Thoracic back: Normal.     Lumbar back: Tenderness present. No swelling, edema, deformity, signs of trauma, lacerations, spasms or bony tenderness. Normal range of motion. Negative right straight leg raise test and negative left straight leg raise test. No scoliosis.     Right hip: No deformity, lacerations, tenderness, bony tenderness or crepitus. Normal range of motion. Normal strength.     Left hip: No deformity, lacerations, tenderness, bony tenderness or crepitus. Normal range of motion. Normal strength.     Right upper leg: Normal.     Left upper leg: Normal.     Right knee: Normal.     Left knee: No swelling, deformity, effusion, erythema, ecchymosis, lacerations, bony tenderness or crepitus. Normal range of motion. Tenderness present over the lateral joint line and LCL. No medial joint line, MCL, ACL, PCL or patellar tendon tenderness. No LCL laxity, MCL laxity, ACL laxity or PCL laxity.Normal alignment, normal meniscus and normal patellar mobility. Normal pulse.     Right  lower leg: No edema.     Left lower leg: Normal. No edema.  Skin:    General: Skin is warm and dry.     Capillary Refill: Capillary refill takes less than 2 seconds.  Neurological:     General: No focal deficit present.     Mental Status: He is alert and oriented to person, place, and time.  Psychiatric:        Mood and Affect: Mood normal.        Behavior: Behavior normal.  Thought Content: Thought content normal.        Judgment: Judgment normal.    Physical Exam   MUSCULOSKELETAL: No laxity in the left knee, ligaments intact. Pain on manipulation of the left knee.        Results for orders placed or performed in visit on 02/13/20  Novel Coronavirus, NAA ([facility])   Collection Time: 02/13/20  6:56 PM   Specimen: Nasopharyngeal(NP) swabs in vial transport medium   Nasopharynge  Result Value Ref Range   SARS-CoV-2, NAA Detected (A) Not Detected  SARS-COV-2, NAA 2 DAY TAT   Collection Time: 02/13/20  6:56 PM   Nasopharynge  Result Value Ref Range   SARS-CoV-2, NAA 2 DAY TAT Performed        Pertinent labs & imaging results that were available during my care of the patient were reviewed by me and considered in my medical decision making.  Assessment & Plan:  Tim Waters was seen today for knee pain.  Diagnoses and all orders for this visit:  Bilateral hip pain -     naproxen  (NAPROSYN ) 500 MG tablet; Take 1 tablet (500 mg total) by mouth 2 (two) times daily with a meal for 14 days.  Acute bilateral low back pain without sciatica -     naproxen  (NAPROSYN ) 500 MG tablet; Take 1 tablet (500 mg total) by mouth 2 (two) times daily with a meal for 14 days.  Acute pain of left knee -     naproxen  (NAPROSYN ) 500 MG tablet; Take 1 tablet (500 mg total) by mouth 2 (two) times daily with a meal for 14 days.     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 Pain has been present for about a week, with swelling and difficulty bending  the knee to 90 degrees. No laxity or ligament injury noted. Likely wear and tear or strain rather than a more serious injury. - Applied ACE wrap to the left knee. - Prescribed medication to be taken twice daily with food for two weeks. - Advised to report if no significant improvement in symptoms after medication course for potential imaging.  Bilateral hip pain Described as aching in the lower back and sharp pain in the inner hip. Pain has been present for about a week. Likely related to body mechanics and lifting techniques used in his job. - Advised on proper body mechanics and lifting techniques to prevent further strain.          Continue all other maintenance medications.  Follow up plan: Return if symptoms worsen or fail to improve.   Continue healthy lifestyle choices, including diet (rich in fruits, vegetables, and lean proteins, and low in salt and simple carbohydrates) and exercise (at least 30 minutes of moderate physical activity daily).  Educational handout given for knee pain, hip pain, low back pain  The above assessment and management plan was discussed with the patient. The patient verbalized understanding of and has agreed to the management plan. Patient is aware to call the clinic if they develop any new symptoms or if symptoms persist or worsen. Patient is aware when to return to the clinic for a follow-up visit. Patient educated on when it is appropriate to go to the emergency department.   Rosaline Bruns, FNP-C [facility] [phone]
